# Patient Record
Sex: Male | Born: 1960 | Race: White | Hispanic: No | Marital: Married | Smoking: Current every day smoker
Health system: Southern US, Community
[De-identification: ages and names within clinical notes are randomized; demographics above are authoritative.]

## PROBLEM LIST (undated history)

## (undated) DIAGNOSIS — B1920 Unspecified viral hepatitis C without hepatic coma: Secondary | ICD-10-CM

## (undated) DIAGNOSIS — E162 Hypoglycemia, unspecified: Secondary | ICD-10-CM

## (undated) DIAGNOSIS — K439 Ventral hernia without obstruction or gangrene: Secondary | ICD-10-CM

## (undated) DIAGNOSIS — K859 Acute pancreatitis without necrosis or infection, unspecified: Secondary | ICD-10-CM

## (undated) DIAGNOSIS — G629 Polyneuropathy, unspecified: Secondary | ICD-10-CM

---

## 2011-06-07 ENCOUNTER — Ambulatory Visit: Payer: Self-pay | Admitting: Emergency Medicine

## 2012-12-13 ENCOUNTER — Emergency Department: Payer: Self-pay | Admitting: Emergency Medicine

## 2012-12-13 ENCOUNTER — Ambulatory Visit: Payer: Self-pay | Admitting: Family Medicine

## 2012-12-21 ENCOUNTER — Emergency Department: Payer: Self-pay | Admitting: Emergency Medicine

## 2013-11-12 ENCOUNTER — Emergency Department: Payer: Self-pay | Admitting: Internal Medicine

## 2013-11-12 LAB — COMPREHENSIVE METABOLIC PANEL
ALK PHOS: 72 U/L
ALT: 77 U/L — AB
AST: 138 U/L — AB (ref 15–37)
Albumin: 4.1 g/dL (ref 3.4–5.0)
Anion Gap: 11 (ref 7–16)
BILIRUBIN TOTAL: 1.4 mg/dL — AB (ref 0.2–1.0)
BUN: 10 mg/dL (ref 7–18)
CALCIUM: 8.9 mg/dL (ref 8.5–10.1)
CHLORIDE: 94 mmol/L — AB (ref 98–107)
Co2: 26 mmol/L (ref 21–32)
Creatinine: 0.83 mg/dL (ref 0.60–1.30)
Glucose: 138 mg/dL — ABNORMAL HIGH (ref 65–99)
OSMOLALITY: 264 (ref 275–301)
Potassium: 3 mmol/L — ABNORMAL LOW (ref 3.5–5.1)
Sodium: 131 mmol/L — ABNORMAL LOW (ref 136–145)
TOTAL PROTEIN: 6.9 g/dL (ref 6.4–8.2)

## 2013-11-12 LAB — URINALYSIS, COMPLETE
BILIRUBIN, UR: NEGATIVE
Bacteria: NONE SEEN
Blood: NEGATIVE
Glucose,UR: NEGATIVE mg/dL (ref 0–75)
Hyaline Cast: 2
LEUKOCYTE ESTERASE: NEGATIVE
Nitrite: NEGATIVE
PROTEIN: NEGATIVE
Ph: 6 (ref 4.5–8.0)
Specific Gravity: 1.014 (ref 1.003–1.030)
Squamous Epithelial: NONE SEEN
WBC UR: 2 /HPF (ref 0–5)

## 2013-11-12 LAB — DRUG SCREEN, URINE

## 2013-11-12 LAB — CBC
HCT: 46.1 % (ref 40.0–52.0)
HGB: 15.9 g/dL (ref 13.0–18.0)
MCH: 32.8 pg (ref 26.0–34.0)
MCHC: 34.5 g/dL (ref 32.0–36.0)
MCV: 95 fL (ref 80–100)
PLATELETS: 119 10*3/uL — AB (ref 150–440)
RBC: 4.85 10*6/uL (ref 4.40–5.90)
RDW: 16 % — AB (ref 11.5–14.5)
WBC: 10.6 10*3/uL (ref 3.8–10.6)

## 2013-11-12 LAB — PROTIME-INR
INR: 0.9
Prothrombin Time: 12.1 secs (ref 11.5–14.7)

## 2013-11-12 LAB — ETHANOL: Ethanol: <3 mg/dL

## 2013-11-12 LAB — CK TOTAL AND CKMB (NOT AT ARMC)
CK, Total: 313 U/L — ABNORMAL HIGH
CK-MB: 5.1 ng/mL — ABNORMAL HIGH (ref 0.5–3.6)

## 2013-11-17 LAB — CULTURE, BLOOD (SINGLE)

## 2014-09-11 ENCOUNTER — Encounter: Payer: Self-pay | Admitting: Emergency Medicine

## 2014-09-11 ENCOUNTER — Emergency Department: Payer: Medicaid Other

## 2014-09-11 ENCOUNTER — Emergency Department
Admission: EM | Admit: 2014-09-11 | Discharge: 2014-09-11 | Disposition: A | Discharge status: Home / Self Care | Payer: Medicaid Other | Attending: Emergency Medicine | Admitting: Emergency Medicine

## 2014-09-11 DIAGNOSIS — M545 Low back pain: Secondary | ICD-10-CM | POA: Diagnosis not present

## 2014-09-11 DIAGNOSIS — R6883 Chills (without fever): Secondary | ICD-10-CM | POA: Diagnosis not present

## 2014-09-11 DIAGNOSIS — E162 Hypoglycemia, unspecified: Secondary | ICD-10-CM | POA: Diagnosis not present

## 2014-09-11 DIAGNOSIS — R112 Nausea with vomiting, unspecified: Secondary | ICD-10-CM | POA: Diagnosis not present

## 2014-09-11 DIAGNOSIS — Z720 Tobacco use: Secondary | ICD-10-CM | POA: Diagnosis not present

## 2014-09-11 HISTORY — DX: Acute pancreatitis without necrosis or infection, unspecified: K85.90

## 2014-09-11 HISTORY — DX: Unspecified viral hepatitis C without hepatic coma: B19.20

## 2014-09-11 HISTORY — DX: Polyneuropathy, unspecified: G62.9

## 2014-09-11 LAB — URINALYSIS COMPLETE WITH MICROSCOPIC (ARMC ONLY)
BILIRUBIN URINE: NEGATIVE
Bacteria, UA: NONE SEEN
GLUCOSE, UA: 50 mg/dL — AB
Leukocytes, UA: NEGATIVE
NITRITE: NEGATIVE
PROTEIN: NEGATIVE mg/dL
SPECIFIC GRAVITY, URINE: 1.02 (ref 1.005–1.030)
pH: 5 (ref 5.0–8.0)

## 2014-09-11 LAB — TROPONIN I

## 2014-09-11 LAB — GLUCOSE, CAPILLARY: GLUCOSE-CAPILLARY: 229 mg/dL — AB (ref 65–99)

## 2014-09-11 LAB — BASIC METABOLIC PANEL
ANION GAP: 16 — AB (ref 5–15)
BUN: 12 mg/dL (ref 6–20)
CALCIUM: 9.2 mg/dL (ref 8.9–10.3)
CO2: 21 mmol/L — ABNORMAL LOW (ref 22–32)
Chloride: 93 mmol/L — ABNORMAL LOW (ref 101–111)
Creatinine, Ser: 0.79 mg/dL (ref 0.61–1.24)
Glucose, Bld: 235 mg/dL — ABNORMAL HIGH (ref 65–99)
Potassium: 4.7 mmol/L (ref 3.5–5.1)
Sodium: 130 mmol/L — ABNORMAL LOW (ref 135–145)

## 2014-09-11 LAB — CBC
HCT: 51 % (ref 40.0–52.0)
HEMOGLOBIN: 17.7 g/dL (ref 13.0–18.0)
MCH: 32.2 pg (ref 26.0–34.0)
MCHC: 34.8 g/dL (ref 32.0–36.0)
MCV: 92.7 fL (ref 80.0–100.0)
Platelets: 237 10*3/uL (ref 150–440)
RBC: 5.5 MIL/uL (ref 4.40–5.90)
RDW: 13.2 % (ref 11.5–14.5)
WBC: 17.1 10*3/uL — ABNORMAL HIGH (ref 3.8–10.6)

## 2014-09-11 LAB — HEPATIC FUNCTION PANEL
ALT: 87 U/L — AB (ref 17–63)
AST: 83 U/L — ABNORMAL HIGH (ref 15–41)
Albumin: 4.6 g/dL (ref 3.5–5.0)
Alkaline Phosphatase: 48 U/L (ref 38–126)
BILIRUBIN INDIRECT: 0.4 mg/dL (ref 0.3–0.9)
Bilirubin, Direct: 0.2 mg/dL (ref 0.1–0.5)
TOTAL PROTEIN: 7.3 g/dL (ref 6.5–8.1)
Total Bilirubin: 0.6 mg/dL (ref 0.3–1.2)

## 2014-09-11 LAB — LIPASE, BLOOD: Lipase: 13 U/L — ABNORMAL LOW (ref 22–51)

## 2014-09-11 MED ORDER — ONDANSETRON HCL 4 MG/2ML IJ SOLN
4.0000 mg | Freq: Once | INTRAMUSCULAR | Status: AC
  Administered 2014-09-11: 4 mg via INTRAVENOUS
  Filled 2014-09-11: qty 2

## 2014-09-11 MED ORDER — IOHEXOL 350 MG/ML SOLN
100.0000 mL | Freq: Once | INTRAVENOUS | Status: AC | PRN
  Administered 2014-09-11: 100 mL via INTRAVENOUS

## 2014-09-11 MED ORDER — MORPHINE SULFATE (PF) 4 MG/ML IV SOLN
4.0000 mg | Freq: Once | INTRAVENOUS | Status: AC
  Administered 2014-09-11: 4 mg via INTRAVENOUS
  Filled 2014-09-11: qty 1

## 2014-09-11 MED ORDER — SODIUM CHLORIDE 0.9 % IV BOLUS (SEPSIS)
1000.0000 mL | Freq: Once | INTRAVENOUS | Status: AC
  Administered 2014-09-11: 1000 mL via INTRAVENOUS

## 2014-09-11 MED ORDER — PROMETHAZINE HCL 25 MG PO TABS: 25.0000 mg | ORAL_TABLET | Freq: Four times a day (QID) | ORAL | Status: DC | PRN

## 2014-09-11 MED ORDER — IOHEXOL 240 MG/ML SOLN
25.0000 mL | Freq: Once | INTRAMUSCULAR | Status: AC | PRN
  Administered 2014-09-11: 25 mL via ORAL

## 2014-09-11 NOTE — ED Provider Notes (Signed)
[facility] Emergency Department Provider Note  Time seen: 6:28 PM  I have reviewed the triage vital signs and the nursing notes.   HISTORY  Chief Complaint Hypoglycemia    HPI Andres Pett. is a 54 y.o. male with a past medical history of pancreatitis, hepatitis C, presents the emergency department with nausea, vomiting, weakness and chills. According to the patient overnight he began feeling very nauseated, vomiting, feeling chills and generalized fatigue/weakness. He has been unable to keep down any liquids today due to the nausea and vomiting per patient. He finally called EMS at 1 PM and they were beginning to transport the patient but noted him to be hypoglycemic at 25, dosed him with dextrose, and his blood glucose improved. The patient thought this was probably the problem so he went back home. He states however for the last 4 or 5 hours since getting back home he has continued to feel very nauseated and weak and fatigued. Patient does not have a history of diabetes, nor does he take any diabetic medications. Patient has had pancreatitis in the past due to alcohol. Patient does drink alcohol he states most days of the week but not every day. He also states she was recently admitted to [facility] about 1.5 months ago for colitis. Denies any abdominal pain now or at any time. Does state mild nausea but much better after receiving Zofran by EMS.     Past Medical History  Diagnosis Date  . Hepatitis C   . Pancreatitis   . Neuropathy     There are no active problems to display for this patient.   No past surgical history on file.  No current outpatient prescriptions on file.  Allergies Review of patient's allergies indicates no known allergies.  No family history on file.  Social History Social History  Substance Use Topics  . Smoking status: Current Every Day Smoker  . Smokeless tobacco: None  . Alcohol Use: Yes    Review of  Systems Constitutional: Negative for fever. Cardiovascular: Negative for chest pain. Respiratory: Negative for shortness of breath. Gastrointestinal: Negative for abdominal pain. Positive for nausea and vomiting. Negative for diarrhea. Genitourinary: Negative for dysuria. Musculoskeletal: Mild lower back pain. 10-point ROS otherwise negative.  ____________________________________________   PHYSICAL EXAM:  VITAL SIGNS: ED Triage Vitals  Enc Vitals Group     BP 09/11/14 1729 135/77 mmHg     Pulse Rate 09/11/14 1729 122     Resp 09/11/14 1729 18     Temp 09/11/14 1729 97.1 F (36.2 C)     Temp Source 09/11/14 1729 Oral     SpO2 09/11/14 1729 97 %     Weight 09/11/14 1729 165 lb (74.844 kg)     Height 09/11/14 1729 5\' 11"  (1.803 m)     Head Cir --      Peak Flow --      Pain Score --      Pain Loc --      Pain Edu? --      Excl. in GC? --     Constitutional: Alert and oriented, no distress, but does appear fatigued. Eyes: Normal exam ENT   Mouth/Throat: Dry mucous membranes. Cardiovascular: Normal rate, regular rhythm. No murmurs, rubs, or gallops. Respiratory: Normal respiratory effort without tachypnea nor retractions. Breath sounds are clear Gastrointestinal: Soft and nontender. No distention.   Musculoskeletal: Nontender with normal range of motion in all extremities.  Neurologic:  Normal speech and language. No gross  focal neurologic deficits Skin:  Skin is warm, dry and intact.  Psychiatric: Mood and affect are normal. Speech and behavior are normal.  ____________________________________________     RADIOLOGY  CT within normal limits  ____________________________________________   INITIAL IMPRESSION / ASSESSMENT AND PLAN / ED COURSE  Pertinent labs & imaging results that were available during my care of the patient were reviewed by me and considered in my medical decision making (see chart for details).  Patient presents for nausea, vomiting, unable  to tolerate fluids today. Had a hypoglycemic episode with a glucose of 25, and the patient is not diabetic. We will check labs including hepatic function panel and lipase. We'll IV hydrate. The patient denies any current nausea, denies any abdominal pain now or at any time.  Labs largely within normal limits/baseline. Discussed with the patient we'll proceed with a CT abdomen and pelvis to help further evaluate.  Labs are largely within normal limits, CT does not show any acute abnormality. Patient states he is feeling much better, no longer feels nauseated, has no abdominal pain, he is asking to be discharged home. He states he has a follow-up appointment tomorrow with his primary care physician. I discussed strict return precautions for any further nausea, vomiting unable to keep down fluids, abdominal pain or fever. Patient is agreeable to plan. We will discharge home with Phenergan as needed, I told the patient he needs to drink plenty of fluids and glucose-containing fluids of the next several days. Patient is agreeable to plan. ____________________________________________   FINAL CLINICAL IMPRESSION(S) / ED DIAGNOSES  Nausea and vomiting Hypoglycemia   Minna Antis, MD 09/11/14 2149

## 2014-09-11 NOTE — ED Notes (Signed)
Pt with low blood sugar today. Ems came out to his house and blood sugar was 25, ems gave D50 and brought it up to 253, this was at 1300 today.

## 2014-09-11 NOTE — Discharge Instructions (Signed)
Nausea and Vomiting Nausea means you feel sick to your stomach. Throwing up (vomiting) is a reflex where stomach contents come out of your mouth. HOME CARE   Take medicine as told by your doctor.  Do not force yourself to eat. However, you do need to drink fluids.  If you feel like eating, eat a normal diet as told by your doctor.  Eat rice, wheat, potatoes, bread, lean meats, yogurt, fruits, and vegetables.  Avoid high-fat foods.  Drink enough fluids to keep your pee (urine) clear or pale yellow.  Ask your doctor how to replace body fluid losses (rehydrate). Signs of body fluid loss (dehydration) include:  Feeling very thirsty.  Dry lips and mouth.  Feeling dizzy.  Dark pee.  Peeing less than normal.  Feeling confused.  Fast breathing or heart rate. GET HELP RIGHT AWAY IF:   You have blood in your throw up.  You have black or bloody poop (stool).  You have a bad headache or stiff neck.  You feel confused.  You have bad belly (abdominal) pain.  You have chest pain or trouble breathing.  You do not pee at least once every 8 hours.  You have cold, clammy skin.  You keep throwing up after 24 to 48 hours.  You have a fever. MAKE SURE YOU:   Understand these instructions.  Will watch your condition.  Will get help right away if you are not doing well or get worse. Document Released: 06/09/2007 Document Revised: 03/15/2011 Document Reviewed: 05/22/2010 [facility] Patient Information 2015 [place], [place]. This information is not intended to replace advice given to you by your health care provider. Make sure you discuss any questions you have with your health care provider.  Low Blood Sugar Low blood sugar (hypoglycemia) means that the level of sugar in your blood is lower than it should be. Signs of low blood sugar include:  Getting sweaty.  Feeling hungry.  Feeling dizzy or weak.  Feeling sleepier than normal.  Feeling nervous.  Headaches.  Having  a fast heartbeat. Low blood sugar can happen fast and can be an emergency. Your doctor can do tests to check your blood sugar level. You can have low blood sugar and not have diabetes. HOME CARE  Check your blood sugar as told by your doctor. If it is less than 70 mg/dl or as told by your doctor, take 1 of the following:  3 to 4 glucose tablets.   cup clear juice.   cup soda pop, not diet.  1 cup milk.  5 to 6 hard candies.  Recheck blood sugar after 15 minutes. Repeat until it is at the right level.  Eat a snack if it is more than 1 hour until the next meal.  Only take medicine as told by your doctor.  Do not skip meals. Eat on time.  Do not drink alcohol except with meals.  Check your blood glucose before driving.  Check your blood glucose before and after exercise.  Always carry treatment with you, such as glucose pills.  Always wear a medical alert bracelet if you have diabetes. GET HELP RIGHT AWAY IF:   Your blood glucose goes below 70 mg/dl or as told by your doctor, and you:  Are confused.  Are not able to swallow.  Pass out (faint).  You cannot treat yourself. You may need someone to help you.  You have low blood sugar problems often.  You have problems from your medicines.  You are not feeling  better after 3 to 4 days.  You have vision changes. MAKE SURE YOU:   Understand these instructions.  Will watch this condition.  Will get help right away if you are not doing well or get worse. Document Released: 03/17/2009 Document Revised: 03/15/2011 Document Reviewed: 03/17/2009 [facility] Patient Information 2015 [place], [place]. This information is not intended to replace advice given to you by your health care provider. Make sure you discuss any questions you have with your health care provider.

## 2015-01-27 ENCOUNTER — Other Ambulatory Visit: Payer: Self-pay | Admitting: Gastroenterology

## 2015-01-27 DIAGNOSIS — B182 Chronic viral hepatitis C: Secondary | ICD-10-CM

## 2015-01-29 ENCOUNTER — Ambulatory Visit: Admission: RE | Admit: 2015-01-29 | Payer: Medicaid Other | Source: Ambulatory Visit

## 2015-02-03 ENCOUNTER — Ambulatory Visit: Payer: Medicaid Other

## 2015-03-24 ENCOUNTER — Ambulatory Visit: Admission: RE | Admit: 2015-03-24 | Payer: Medicaid Other | Source: Ambulatory Visit

## 2015-04-15 ENCOUNTER — Observation Stay
Admission: EM | Admit: 2015-04-15 | Discharge: 2015-04-16 | Disposition: A | Discharge status: Home / Self Care | Payer: Medicaid Other | Attending: Internal Medicine | Admitting: Internal Medicine

## 2015-04-15 ENCOUNTER — Emergency Department: Payer: Medicaid Other

## 2015-04-15 ENCOUNTER — Encounter: Payer: Self-pay | Admitting: *Deleted

## 2015-04-15 DIAGNOSIS — K219 Gastro-esophageal reflux disease without esophagitis: Secondary | ICD-10-CM | POA: Diagnosis not present

## 2015-04-15 DIAGNOSIS — E870 Hyperosmolality and hypernatremia: Secondary | ICD-10-CM | POA: Diagnosis not present

## 2015-04-15 DIAGNOSIS — G629 Polyneuropathy, unspecified: Secondary | ICD-10-CM | POA: Insufficient documentation

## 2015-04-15 DIAGNOSIS — B1920 Unspecified viral hepatitis C without hepatic coma: Secondary | ICD-10-CM | POA: Insufficient documentation

## 2015-04-15 DIAGNOSIS — Z8719 Personal history of other diseases of the digestive system: Secondary | ICD-10-CM | POA: Insufficient documentation

## 2015-04-15 DIAGNOSIS — F17210 Nicotine dependence, cigarettes, uncomplicated: Secondary | ICD-10-CM | POA: Insufficient documentation

## 2015-04-15 DIAGNOSIS — F05 Delirium due to known physiological condition: Secondary | ICD-10-CM | POA: Insufficient documentation

## 2015-04-15 DIAGNOSIS — R569 Unspecified convulsions: Secondary | ICD-10-CM | POA: Diagnosis present

## 2015-04-15 DIAGNOSIS — G40309 Generalized idiopathic epilepsy and epileptic syndromes, not intractable, without status epilepticus: Principal | ICD-10-CM | POA: Insufficient documentation

## 2015-04-15 DIAGNOSIS — E871 Hypo-osmolality and hyponatremia: Secondary | ICD-10-CM | POA: Diagnosis not present

## 2015-04-15 DIAGNOSIS — G319 Degenerative disease of nervous system, unspecified: Secondary | ICD-10-CM | POA: Insufficient documentation

## 2015-04-15 HISTORY — DX: Hypoglycemia, unspecified: E16.2

## 2015-04-15 HISTORY — DX: Ventral hernia without obstruction or gangrene: K43.9

## 2015-04-15 LAB — BASIC METABOLIC PANEL
Anion gap: 10 (ref 5–15)
BUN: 9 mg/dL (ref 6–20)
CHLORIDE: 99 mmol/L — AB (ref 101–111)
CO2: 24 mmol/L (ref 22–32)
CREATININE: 0.83 mg/dL (ref 0.61–1.24)
Calcium: 9.2 mg/dL (ref 8.9–10.3)
GFR calc Af Amer: >60 mL/min (ref >60)
GFR calc non Af Amer: >60 mL/min (ref >60)
Glucose, Bld: 152 mg/dL — ABNORMAL HIGH (ref 65–99)
Potassium: 3.6 mmol/L (ref 3.5–5.1)
SODIUM: 133 mmol/L — AB (ref 135–145)

## 2015-04-15 LAB — CBC WITH DIFFERENTIAL/PLATELET
Basophils Absolute: 0.1 10*3/uL (ref 0–0.1)
Basophils Relative: 1 %
EOS ABS: 0.2 10*3/uL (ref 0–0.7)
EOS PCT: 2 %
HCT: 41.5 % (ref 40.0–52.0)
HEMOGLOBIN: 14.5 g/dL (ref 13.0–18.0)
LYMPHS ABS: 1.9 10*3/uL (ref 1.0–3.6)
Lymphocytes Relative: 25 %
MCH: 33.7 pg (ref 26.0–34.0)
MCHC: 34.8 g/dL (ref 32.0–36.0)
MCV: 96.8 fL (ref 80.0–100.0)
MONO ABS: 0.9 10*3/uL (ref 0.2–1.0)
MONOS PCT: 12 %
Neutro Abs: 4.7 10*3/uL (ref 1.4–6.5)
Neutrophils Relative %: 60 %
PLATELETS: 198 10*3/uL (ref 150–440)
RBC: 4.29 MIL/uL — ABNORMAL LOW (ref 4.40–5.90)
RDW: 14.3 % (ref 11.5–14.5)
WBC: 7.7 10*3/uL (ref 3.8–10.6)

## 2015-04-15 LAB — URINE DRUG SCREEN, QUALITATIVE (ARMC ONLY)
Amphetamines, Ur Screen: NOT DETECTED
Barbiturates, Ur Screen: NOT DETECTED
Benzodiazepine, Ur Scrn: NOT DETECTED
CANNABINOID 50 NG, UR ~~LOC~~: POSITIVE — AB
COCAINE METABOLITE, UR ~~LOC~~: NOT DETECTED
MDMA (ECSTASY) UR SCREEN: NOT DETECTED
Methadone Scn, Ur: NOT DETECTED
Opiate, Ur Screen: NOT DETECTED
PHENCYCLIDINE (PCP) UR S: NOT DETECTED
Tricyclic, Ur Screen: NOT DETECTED

## 2015-04-15 LAB — ETHANOL

## 2015-04-15 MED ORDER — OXYCODONE HCL 5 MG PO TABS
5.0000 mg | ORAL_TABLET | ORAL | Status: DC | PRN
  Administered 2015-04-15 – 2015-04-16 (×2): 5 mg via ORAL
  Filled 2015-04-15 (×2): qty 1

## 2015-04-15 MED ORDER — ONDANSETRON HCL 4 MG/2ML IJ SOLN: 4.0000 mg | Freq: Four times a day (QID) | INTRAMUSCULAR | Status: DC | PRN

## 2015-04-15 MED ORDER — SODIUM CHLORIDE 0.9 % IV SOLN
1000.0000 mg | Freq: Two times a day (BID) | INTRAVENOUS | Status: DC
  Administered 2015-04-15 – 2015-04-16 (×2): 1000 mg via INTRAVENOUS
  Filled 2015-04-15 (×3): qty 10

## 2015-04-15 MED ORDER — ACETAMINOPHEN 325 MG PO TABS: 650.0000 mg | ORAL_TABLET | Freq: Four times a day (QID) | ORAL | Status: DC | PRN

## 2015-04-15 MED ORDER — ACETAMINOPHEN 650 MG RE SUPP: 650.0000 mg | Freq: Four times a day (QID) | RECTAL | Status: DC | PRN

## 2015-04-15 MED ORDER — MORPHINE SULFATE (PF) 2 MG/ML IV SOLN
2.0000 mg | INTRAVENOUS | Status: DC | PRN
  Administered 2015-04-15 – 2015-04-16 (×2): 2 mg via INTRAVENOUS
  Filled 2015-04-15 (×2): qty 1

## 2015-04-15 MED ORDER — ONDANSETRON HCL 4 MG PO TABS: 4.0000 mg | ORAL_TABLET | Freq: Four times a day (QID) | ORAL | Status: DC | PRN

## 2015-04-15 MED ORDER — SODIUM CHLORIDE 0.9 % IV SOLN
INTRAVENOUS | Status: DC
  Administered 2015-04-15 – 2015-04-16 (×2): via INTRAVENOUS

## 2015-04-15 MED ORDER — ENOXAPARIN SODIUM 40 MG/0.4ML ~~LOC~~ SOLN
40.0000 mg | Freq: Every day | SUBCUTANEOUS | Status: DC
  Administered 2015-04-15: 23:00:00 40 mg via SUBCUTANEOUS
  Filled 2015-04-15: qty 0.4

## 2015-04-15 MED ORDER — ENOXAPARIN SODIUM 40 MG/0.4ML ~~LOC~~ SOLN: 30.0000 mg | SUBCUTANEOUS | Status: DC

## 2015-04-15 NOTE — ED Notes (Signed)
Pt arrived to ED from home after wife reports pt had a 2 minute long seizure. Pt fell from a standing position and hit head on a wooden table. EMS reports pt broke the table with his head. Pt reports he has left sided head pain at this time. EMS reports pt had a second seizure prior to their arrival and was posticle. EMS reports pt was confused throughout transport however pt is A&O x 4 at this time. Pt presents tired but is able to talk in complete sentence and answer questions. Pt has hx of of seizures but has not seen a neurologist and has never taken medications. Pts wife reports last seizure occurred 1 year ago.

## 2015-04-15 NOTE — ED Notes (Signed)
Adam, RN attempted to call report to 1C.  Receiving nurse in pt's room and unavailable to take report at this time.

## 2015-04-15 NOTE — H&P (Signed)
[facility] [place] [facility]   PATIENT NAME: Andres ShepherdRobert Brown    MR#:  [id number]  DATE OF BIRTH:  1960/03/23   DATE OF ADMISSION:  04/15/2015  PRIMARY CARE PHYSICIAN: No primary care provider on file.   REQUESTING/REFERRING PHYSICIAN: Derrill KayGoodman  CHIEF COMPLAINT:   Chief Complaint  Patient presents with  . Seizures    HISTORY OF PRESENT ILLNESS:  Andres ShepherdRobert Brown  is a 55 y.o. male with a known history of hepatitis C who is presenting with seizure. Witnessed seizure by wife who is present at bedside states that full tonic-clonic seizure followed by postictal state in total entire episode lasted 15 minutes (including postictal). Patient states in usual state of health no illnesses or further issues prior to seizure. He has had 2 prior seizures which occurred the same day tonic-clonic approximate 1-1.5 years ago.  PAST MEDICAL HISTORY:   Past Medical History  Diagnosis Date  . Hepatitis C   . Pancreatitis   . Neuropathy (HCC)   . Hypoglycemia   . Hernia of abdominal wall     left sided. PCP aware.     PAST SURGICAL HISTORY:  History reviewed. No pertinent past surgical history.  SOCIAL HISTORY:   Social History  Substance Use Topics  . Smoking status: Current Every Day Smoker -- 1.00 packs/day    Types: Cigarettes  . Smokeless tobacco: Not on file  . Alcohol Use: Yes     Comment: occasionally    FAMILY HISTORY:   Family History  Problem Relation Age of Onset  . Seizures Neg Hx     DRUG ALLERGIES:  No Known Allergies  REVIEW OF SYSTEMS:  REVIEW OF SYSTEMS:  CONSTITUTIONAL: Denies fevers, chills, fatigue, weakness.  EYES: Denies blurred vision, double vision, or eye pain.  EARS, NOSE, THROAT: Denies tinnitus, ear pain, hearing loss.  RESPIRATORY: denies cough, shortness of breath, wheezing  CARDIOVASCULAR: Denies chest pain, palpitations, edema.  GASTROINTESTINAL: Denies nausea, vomiting, diarrhea, abdominal pain.  GENITOURINARY: Denies  dysuria, hematuria.  ENDOCRINE: Denies nocturia or thyroid problems. HEMATOLOGIC AND LYMPHATIC: Denies easy bruising or bleeding.  SKIN: Denies rash or lesions.  MUSCULOSKELETAL: Denies pain in neck, back, shoulder, knees, hips, or further arthritic symptoms.  NEUROLOGIC: Denies paralysis, paresthesias.  PSYCHIATRIC: Denies anxiety or depressive symptoms. Otherwise full review of systems performed by me is negative.   MEDICATIONS AT HOME:   Prior to Admission medications   Not on File      VITAL SIGNS:  Blood pressure 131/86, pulse 79, temperature 98.3 F (36.8 C), temperature source Oral, resp. rate 22, height 6' (1.829 m), weight 74.844 kg (165 lb), SpO2 96 %.  PHYSICAL EXAMINATION:  VITAL SIGNS: Filed Vitals:   04/15/15 1930 04/15/15 2000  BP: 118/80 131/86  Pulse: 81 79  Temp:    Resp: 17 22   GENERAL:54 y.o.male currently in no acute distress.  HEAD: Normocephalic, atraumatic.  EYES: Pupils equal, round, reactive to light. Extraocular muscles intact. No scleral icterus.  MOUTH: Moist mucosal membrane. Dentition intact. No abscess noted.  EAR, NOSE, THROAT: Clear without exudates. No external lesions.  NECK: Supple. No thyromegaly. No nodules. No JVD.  PULMONARY: Clear to ascultation, without wheeze rails or rhonci. No use of accessory muscles, Good respiratory effort. good air entry bilaterally CHEST: Nontender to palpation.  CARDIOVASCULAR: S1 and S2. Regular rate and rhythm. No murmurs, rubs, or gallops. No edema. Pedal pulses 2+ bilaterally.  GASTROINTESTINAL: Soft, nontender, nondistended. No masses. Positive bowel sounds. No hepatosplenomegaly.  MUSCULOSKELETAL: No swelling, clubbing, or edema. Range of motion full in all extremities.  NEUROLOGIC: Cranial nerves II through XII are intact. No gross focal neurological deficits. Sensation intact. Reflexes intact.  SKIN: No ulceration, lesions, rashes, or cyanosis. Skin warm and dry. Turgor intact.  PSYCHIATRIC: Mood,  affect within normal limits. The patient is awake, alert and oriented x 3. Insight, judgment intact.    LABORATORY PANEL:   CBC  Recent Labs Lab 04/15/15 1853  WBC 7.7  HGB 14.5  HCT 41.5  PLT 198   ------------------------------------------------------------------------------------------------------------------  Chemistries   Recent Labs Lab 04/15/15 1853  NA 133*  K 3.6  CL 99*  CO2 24  GLUCOSE 152*  BUN 9  CREATININE 0.83  CALCIUM 9.2   ------------------------------------------------------------------------------------------------------------------  Cardiac Enzymes No results for input(s): TROPONINI in the last 168 hours. ------------------------------------------------------------------------------------------------------------------  RADIOLOGY:  Ct Head Wo Contrast  04/15/2015  CLINICAL DATA:  Status post fall, seizure EXAM: CT HEAD WITHOUT CONTRAST TECHNIQUE: Contiguous axial images were obtained from the base of the skull through the vertex without intravenous contrast. COMPARISON:  None. FINDINGS: There is no evidence of mass effect, midline shift, or extra-axial fluid collections. There is no evidence of a space-occupying lesion or intracranial hemorrhage. There is no evidence of a cortical-based area of acute infarction. There is generalized cerebral atrophy. The ventricles and sulci are appropriate for the patient's age. The basal cisterns are patent. Visualized portions of the orbits are unremarkable. The visualized portions of the paranasal sinuses and mastoid air cells are unremarkable. The osseous structures are unremarkable. IMPRESSION: No acute intracranial pathology. Electronically Signed   By: Elige Ko   On: 04/15/2015 19:26    EKG:   Orders placed or performed during the hospital encounter of 04/15/15  . EKG 12-Lead  . EKG 12-Lead    IMPRESSION AND PLAN:   55 year old Caucasian gentleman history of hepatitis C presenting after seizures  1.  Seizure: Neurology consult Keppra load seizure precautions with neuro checks 2. Hyponatremia: IV fluid hydration follow sodium 3. Venous thrombi embolism prophylactic: Lovenox    All the records are reviewed and case discussed with ED provider. Management plans discussed with the patient, family and they are in agreement.  CODE STATUS: Full  TOTAL TIME TAKING CARE OF THIS PATIENT: 33 minutes.    Hower,  Mardi Mainland.D on 04/15/2015 at 9:02 PM  Between 7am to 6pm - Pager - [phone]  After 6pm: House Pager: - [phone]  [facility]  Office  [phone]  CC: Primary care physician; No primary care provider on file.

## 2015-04-15 NOTE — ED Provider Notes (Signed)
[facility] Emergency Department Provider Note    ____________________________________________  Time seen: ~2025  I have reviewed the triage vital signs and the nursing notes.   HISTORY  Chief Complaint Seizures   History limited by: Not Limited   HPI Andres LowRobert E Macmurray Jr. is a 55 y.o. male who presents to the emergency department today after seizure. The patient states he had had a normal day. Had a drink as normal. He then went up and left the dining room table when he started having a seizure. Wife describes it as the full body shaking. She states that last maybe 2 minutes. Patient was quite confused after this. EMS was called. When EMS Their sugar was 150. During transport patient had a another seizure. Patient does not recall either event. Wife states that the patient had a seizure roughly 1 year ago however at that time it was attributed to hypoglycemia. The patient denies any recent fevers, nausea or vomiting.    Past Medical History  Diagnosis Date  . Hepatitis C   . Pancreatitis   . Neuropathy (HCC)   . Hypoglycemia   . Hernia of abdominal wall     left sided. PCP aware.     There are no active problems to display for this patient.   History reviewed. No pertinent past surgical history.  Current Outpatient Rx  Name  Route  Sig  Dispense  Refill  . promethazine (PHENERGAN) 25 MG tablet   Oral   Take 1 tablet (25 mg total) by mouth every 6 (six) hours as needed for nausea or vomiting.   30 tablet   0     Allergies Review of patient's allergies indicates no known allergies.  History reviewed. No pertinent family history.  Social History Social History  Substance Use Topics  . Smoking status: Current Every Day Smoker -- 1.00 packs/day    Types: Cigarettes  . Smokeless tobacco: None  . Alcohol Use: Yes     Comment: occasionally    Review of Systems  Constitutional: Negative for fever. Cardiovascular: Negative for chest  pain. Respiratory: Negative for shortness of breath. Gastrointestinal: Negative for abdominal pain, vomiting and diarrhea. Neurological: Positive for seizure   10-point ROS otherwise negative.  ____________________________________________   PHYSICAL EXAM:  VITAL SIGNS: ED Triage Vitals  Enc Vitals Group     BP 04/15/15 1848 131/74 mmHg     Pulse Rate 04/15/15 1848 85     Resp 04/15/15 1848 21     Temp 04/15/15 1848 98.3 F (36.8 C)     Temp Source 04/15/15 1848 Oral     SpO2 04/15/15 1841 96 %     Weight 04/15/15 1852 165 lb (74.844 kg)     Height 04/15/15 1852 6' (1.829 m)     Head Cir --      Peak Flow --      Pain Score 04/15/15 1850 8   Constitutional: Alert and oriented. Well appearing and in no distress. Eyes: Conjunctivae are normal. PERRL. Normal extraocular movements. ENT   Head: Normocephalic, Small hematoma to the left frontal temporal region.   Nose: No congestion/rhinnorhea.   Mouth/Throat: Mucous membranes are moist.   Neck: No stridor. No midline tenderness. Hematological/Lymphatic/Immunilogical: No cervical lymphadenopathy. Cardiovascular: Normal rate, regular rhythm.  No murmurs, rubs, or gallops. Respiratory: Normal respiratory effort without tachypnea nor retractions. Breath sounds are clear and equal bilaterally. No wheezes/rales/rhonchi. Gastrointestinal: Soft and nontender. No distention.  Genitourinary: Deferred Musculoskeletal: Normal range of motion in  all extremities. No joint effusions.  Some bruising and abrasion over the left arm however no deformity and normal range of motion. Neurologic:  Normal speech and language. No gross focal neurologic deficits are appreciated.  Skin:  Skin is warm, dry and intact. No rash noted. Psychiatric: Mood and affect are normal. Speech and behavior are normal. Patient exhibits appropriate insight and judgment.  ____________________________________________    LABS (pertinent  positives/negatives)  Labs Reviewed  CBC WITH DIFFERENTIAL/PLATELET - Abnormal; Notable for the following:    RBC 4.29 (*)    All other components within normal limits  BASIC METABOLIC PANEL - Abnormal; Notable for the following:    Sodium 133 (*)    Chloride 99 (*)    Glucose, Bld 152 (*)    All other components within normal limits  ETHANOL  URINE DRUG SCREEN, QUALITATIVE ([facility] ONLY)  BASIC METABOLIC PANEL  CBC     ____________________________________________   EKG  I, Phineas Semen, attending physician, personally viewed and interpreted this EKG  EKG Time: 1848 Rate: 84 Rhythm: normal sinus rhythm Axis: normal Intervals: qtc 450 QRS: narrow ST changes: no st elevation Impression: normal ekg   ____________________________________________    RADIOLOGY  CT head IMPRESSION: No acute intracranial pathology.   ____________________________________________   PROCEDURES  Procedure(s) performed: None  Critical Care performed: No  ____________________________________________   INITIAL IMPRESSION / ASSESSMENT AND PLAN / ED COURSE  Pertinent labs & imaging results that were available during my care of the patient were reviewed by me and considered in my medical decision making (see chart for details).  Patient presented to the emergency department today because of concern for seizure. The patient had 2 seizures this evening. It has not had a seizure workup in the past. No obvious etiology of the seizure at this time. Will admit to the hospital service.  ____________________________________________   FINAL CLINICAL IMPRESSION(S) / ED DIAGNOSES  Final diagnoses:  Seizure (HCC)     Phineas Semen, MD 04/15/15 2152

## 2015-04-16 ENCOUNTER — Observation Stay: Payer: Medicaid Other

## 2015-04-16 LAB — CBC
HCT: 38.6 % — ABNORMAL LOW (ref 40.0–52.0)
Hemoglobin: 13.4 g/dL (ref 13.0–18.0)
MCH: 33.3 pg (ref 26.0–34.0)
MCHC: 34.7 g/dL (ref 32.0–36.0)
MCV: 95.9 fL (ref 80.0–100.0)
PLATELETS: 180 10*3/uL (ref 150–440)
RBC: 4.03 MIL/uL — AB (ref 4.40–5.90)
RDW: 14.5 % (ref 11.5–14.5)
WBC: 8.3 10*3/uL (ref 3.8–10.6)

## 2015-04-16 LAB — HEPATIC FUNCTION PANEL
ALT: 71 U/L — AB (ref 17–63)
AST: 85 U/L — ABNORMAL HIGH (ref 15–41)
Albumin: 3.7 g/dL (ref 3.5–5.0)
Alkaline Phosphatase: 43 U/L (ref 38–126)
BILIRUBIN INDIRECT: 0.3 mg/dL (ref 0.3–0.9)
Bilirubin, Direct: 0.2 mg/dL (ref 0.1–0.5)
TOTAL PROTEIN: 5.9 g/dL — AB (ref 6.5–8.1)
Total Bilirubin: 0.5 mg/dL (ref 0.3–1.2)

## 2015-04-16 LAB — BASIC METABOLIC PANEL
ANION GAP: 6 (ref 5–15)
BUN: 9 mg/dL (ref 6–20)
CHLORIDE: 105 mmol/L (ref 101–111)
CO2: 25 mmol/L (ref 22–32)
Calcium: 8.6 mg/dL — ABNORMAL LOW (ref 8.9–10.3)
Creatinine, Ser: 0.68 mg/dL (ref 0.61–1.24)
GFR calc non Af Amer: >60 mL/min (ref >60)
GLUCOSE: 106 mg/dL — AB (ref 65–99)
POTASSIUM: 3.5 mmol/L (ref 3.5–5.1)
Sodium: 136 mmol/L (ref 135–145)

## 2015-04-16 LAB — LIPASE, BLOOD: Lipase: 20 U/L (ref 11–51)

## 2015-04-16 MED ORDER — PANTOPRAZOLE SODIUM 40 MG PO TBEC: 40.0000 mg | DELAYED_RELEASE_TABLET | Freq: Two times a day (BID) | ORAL | Status: DC

## 2015-04-16 MED ORDER — BENZONATATE 100 MG PO CAPS: 100.0000 mg | ORAL_CAPSULE | Freq: Three times a day (TID) | ORAL | Status: DC | PRN

## 2015-04-16 MED ORDER — LEVETIRACETAM 1000 MG PO TABS: 1000.0000 mg | ORAL_TABLET | Freq: Two times a day (BID) | ORAL | Status: AC

## 2015-04-16 MED ORDER — BENZONATATE 100 MG PO CAPS: 100.0000 mg | ORAL_CAPSULE | Freq: Three times a day (TID) | ORAL | Status: AC | PRN

## 2015-04-16 MED ORDER — LEVETIRACETAM 500 MG PO TABS: 1000.0000 mg | ORAL_TABLET | Freq: Two times a day (BID) | ORAL | Status: DC

## 2015-04-16 MED ORDER — OMEPRAZOLE 20 MG PO CPDR: 20.0000 mg | DELAYED_RELEASE_CAPSULE | Freq: Every day | ORAL | Status: AC

## 2015-04-16 MED ORDER — GADOBENATE DIMEGLUMINE 529 MG/ML IV SOLN
15.0000 mL | Freq: Once | INTRAVENOUS | Status: AC | PRN
  Administered 2015-04-16: 13:00:00 15 mL via INTRAVENOUS

## 2015-04-16 MED ORDER — PANTOPRAZOLE SODIUM 40 MG PO TBEC: 40.0000 mg | DELAYED_RELEASE_TABLET | Freq: Every day | ORAL | Status: DC

## 2015-04-16 MED ORDER — LORAZEPAM 2 MG/ML IJ SOLN
2.0000 mg | Freq: Once | INTRAMUSCULAR | Status: AC
  Administered 2015-04-16: 2 mg via INTRAVENOUS
  Filled 2015-04-16: qty 1

## 2015-04-16 NOTE — Discharge Instructions (Signed)
1. No driving for 6 months after a seizure 2. No marijuana

## 2015-04-16 NOTE — Consult Note (Signed)
CC: seizurs  HPI: Andres LowRobert E Likes Jr. is an 55 y.o. male  male with a known history of hepatitis C who is presenting with seizure. Witnessed seizure by wife who is present at bedside states that full tonic-clonic seizure followed by postictal state in total entire episode lasted 15 minutes (including postictal). Pt had an episode of seizure x 2 about 1-2 yrs ago Currently back to baseline.    Past Medical History  Diagnosis Date  . Hepatitis C   . Pancreatitis   . Neuropathy (HCC)   . Hypoglycemia   . Hernia of abdominal wall     left sided. PCP aware.     History reviewed. No pertinent past surgical history.  Family History  Problem Relation Age of Onset  . Seizures Neg Hx     Social History:  reports that he has been smoking Cigarettes.  He has been smoking about 1.00 pack per day. He does not have any smokeless tobacco history on file. He reports that he drinks alcohol. He reports that he does not use illicit drugs.  No Known Allergies  Medications: I have reviewed the patient's current medications.  ROS: History obtained from the patient  General ROS: negative for - chills, fatigue, fever, night sweats, weight gain or weight loss Psychological ROS: negative for - behavioral disorder, hallucinations, memory difficulties, mood swings or suicidal ideation Ophthalmic ROS: negative for - blurry vision, double vision, eye pain or loss of vision ENT ROS: negative for - epistaxis, nasal discharge, oral lesions, sore throat, tinnitus or vertigo Allergy and Immunology ROS: negative for - hives or itchy/watery eyes Hematological and Lymphatic ROS: negative for - bleeding problems, bruising or swollen lymph nodes Endocrine ROS: negative for - galactorrhea, hair pattern changes, polydipsia/polyuria or temperature intolerance Respiratory ROS: negative for - cough, hemoptysis, shortness of breath or wheezing Cardiovascular ROS: negative for - chest pain, dyspnea on exertion, edema or  irregular heartbeat Gastrointestinal ROS: negative for - abdominal pain, diarrhea, hematemesis, nausea/vomiting or stool incontinence Genito-Urinary ROS: negative for - dysuria, hematuria, incontinence or urinary frequency/urgency Musculoskeletal ROS: negative for - joint swelling or muscular weakness Neurological ROS: as noted in HPI Dermatological ROS: negative for rash and skin lesion changes  Physical Examination: Blood pressure 101/55, pulse 75, temperature 98 F (36.7 C), temperature source Oral, resp. rate 18, height 6' (1.829 m), weight 165 lb (74.844 kg), SpO2 96 %.   Neurological Examination Mental Status: Alert, oriented, thought content appropriate.  Speech fluent without evidence of aphasia.  Able to follow 3 step commands without difficulty. Cranial Nerves: II: Discs flat bilaterally; Visual fields grossly normal, pupils equal, round, reactive to light and accommodation III,IV, VI: ptosis not present, extra-ocular motions intact bilaterally V,VII: smile symmetric, facial light touch sensation normal bilaterally VIII: hearing normal bilaterally IX,X: gag reflex present XI: bilateral shoulder shrug XII: midline tongue extension Motor: Right : Upper extremity   5/5    Left:     Upper extremity   5/5  Lower extremity   5/5     Lower extremity   5/5 Tone and bulk:normal tone throughout; no atrophy noted Sensory: Pinprick and light touch intact throughout, bilaterally Deep Tendon Reflexes: 1+ and symmetric throughout Plantars: Right: downgoing   Left: downgoing Cerebellar: normal finger-to-nose, normal rapid alternating movements and normal heel-to-shin test Gait: not tested      Laboratory Studies:   Basic Metabolic Panel:  Recent Labs Lab 04/15/15 1853 04/16/15 0500  NA 133* 136  K 3.6 3.5  CL 99* 105  CO2 24 25  GLUCOSE 152* 106*  BUN 9 9  CREATININE 0.83 0.68  CALCIUM 9.2 8.6*    Liver Function Tests:  Recent Labs Lab 04/16/15 0500  AST 85*   ALT 71*  ALKPHOS 43  BILITOT 0.5  PROT 5.9*  ALBUMIN 3.7    Recent Labs Lab 04/16/15 0500  LIPASE 20   No results for input(s): AMMONIA in the last 168 hours.  CBC:  Recent Labs Lab 04/15/15 1853 04/16/15 0500  WBC 7.7 8.3  NEUTROABS 4.7  --   HGB 14.5 13.4  HCT 41.5 38.6*  MCV 96.8 95.9  PLT 198 180    Cardiac Enzymes: No results for input(s): CKTOTAL, CKMB, CKMBINDEX, TROPONINI in the last 168 hours.  BNP: Invalid input(s): POCBNP  CBG: No results for input(s): GLUCAP in the last 168 hours.  Microbiology: Results for orders placed or performed in visit on 11/12/13  Culture, blood (single)     Status: None   Collection Time: 11/12/13  4:41 PM  Result Value Ref Range Status   Micro Text Report   Final       COMMENT                   NO GROWTH AEROBICALLY/ANAEROBICALLY IN 5 DAYS   ANTIBIOTIC                                                      Culture, blood (single)     Status: None   Collection Time: 11/12/13  4:45 PM  Result Value Ref Range Status   Micro Text Report   Final       COMMENT                   NO GROWTH AEROBICALLY/ANAEROBICALLY IN 5 DAYS   ANTIBIOTIC                                                        Coagulation Studies: No results for input(s): LABPROT, INR in the last 72 hours.  Urinalysis: No results for input(s): COLORURINE, LABSPEC, PHURINE, GLUCOSEU, HGBUR, BILIRUBINUR, KETONESUR, PROTEINUR, UROBILINOGEN, NITRITE, LEUKOCYTESUR in the last 168 hours.  Invalid input(s): APPERANCEUR  Lipid Panel:  No results found for: CHOL, TRIG, HDL, CHOLHDL, VLDL, LDLCALC  HgbA1C: No results found for: HGBA1C  Urine Drug Screen:     Component Value Date/Time   LABOPIA NONE DETECTED 04/15/2015 2120   LABBENZ NONE DETECTED 04/15/2015 2120   AMPHETMU NONE DETECTED 04/15/2015 2120   THCU POSITIVE* 04/15/2015 2120   LABBARB NONE DETECTED 04/15/2015 2120    Alcohol Level:  Recent Labs Lab 04/15/15 1853  ETH <5    Other  results: EKG: normal EKG, normal sinus rhythm, unchanged from previous tracings.  Imaging: Ct Head Wo Contrast  04/15/2015  CLINICAL DATA:  Status post fall, seizure EXAM: CT HEAD WITHOUT CONTRAST TECHNIQUE: Contiguous axial images were obtained from the base of the skull through the vertex without intravenous contrast. COMPARISON:  None. FINDINGS: There is no evidence of mass effect, midline shift, or extra-axial fluid collections. There is no evidence of a space-occupying lesion or intracranial  hemorrhage. There is no evidence of a cortical-based area of acute infarction. There is generalized cerebral atrophy. The ventricles and sulci are appropriate for the patient's age. The basal cisterns are patent. Visualized portions of the orbits are unremarkable. The visualized portions of the paranasal sinuses and mastoid air cells are unremarkable. The osseous structures are unremarkable. IMPRESSION: No acute intracranial pathology. Electronically Signed   By: Elige Ko   On: 04/15/2015 19:26     Assessment/Plan:  55 y.o. male  male with a known history of hepatitis C who is presenting with seizure. Witnessed seizure by wife who is present at bedside states that full tonic-clonic seizure followed by postictal state in total entire episode lasted 15 minutes (including postictal). Pt had an episode of seizure x 2 about 1-2 yrs ago Currently back to baseline.    - Con't Keppra BID daily indefinately now.   - MRI brain pending with and without contrast - No driving until follow up as out pt - pt/ot - EEG ordered but pt is back to baseline. Not convinced needs it as this point in time - d/c planning after above work up.  Pauletta Browns  04/16/2015, 11:04 AM

## 2015-04-16 NOTE — Progress Notes (Signed)
[facility] - [place] at [facility]   PATIENT NAME: Patricia Perales    MR#:  [id number]  DATE OF BIRTH:  Sep 30, 1960  SUBJECTIVE:  CHIEF COMPLAINT:   Chief Complaint  Patient presents with  . Seizures   - admitted after 2 episodes of tonic-clonic seizures. None since admission. -Receiving IV Keppra. For MRI of his brain today and also neurology consult pending. -Wife at bedside  REVIEW OF SYSTEMS:  Review of Systems  Constitutional: Negative for fever, chills and malaise/fatigue.  HENT: Negative for ear discharge, ear pain, nosebleeds and tinnitus.   Eyes: Negative for blurred vision and double vision.  Respiratory: Negative for cough, shortness of breath and wheezing.   Cardiovascular: Negative for chest pain, palpitations and leg swelling.  Gastrointestinal: Negative for nausea, vomiting, abdominal pain, diarrhea and constipation.  Genitourinary: Negative for dysuria and urgency.  Musculoskeletal: Positive for myalgias.  Neurological: Positive for seizures. Negative for dizziness, sensory change, speech change, focal weakness and headaches.  Psychiatric/Behavioral: Positive for substance abuse.    DRUG ALLERGIES:  No Known Allergies  VITALS:  Blood pressure 101/55, pulse 75, temperature 98 F (36.7 C), temperature source Oral, resp. rate 18, height 6' (1.829 m), weight 74.844 kg (165 lb), SpO2 96 %.  PHYSICAL EXAMINATION:  Physical Exam  GENERAL:  55 y.o.-year-old patient lying in the bed with no acute distress.  EYES: Pupils equal, round, reactive to light and accommodation. No scleral icterus. Extraocular muscles intact.  HEENT: Head atraumatic, normocephalic. Oropharynx and nasopharynx clear. No tongue bite noted. NECK:  Supple, no jugular venous distention. No thyroid enlargement, no tenderness.  LUNGS: Normal breath sounds bilaterally, no wheezing, rales,rhonchi or crepitation. No use of accessory muscles of respiration.  CARDIOVASCULAR: S1, S2  normal. No murmurs, rubs, or gallops.  ABDOMEN: Soft, nontender, nondistended. Bowel sounds present. No organomegaly or mass.  EXTREMITIES: No pedal edema, cyanosis, or clubbing.  NEUROLOGIC: Cranial nerves II through XII are intact. Muscle strength 5/5 in all extremities. Sensation intact. Gait not checked. Slurred speech- wife believes that is because of pain medicine he received PSYCHIATRIC: The patient is alert and oriented x 3.  SKIN: No obvious rash, lesion, or ulcer.    LABORATORY PANEL:   CBC  Recent Labs Lab 04/16/15 0500  WBC 8.3  HGB 13.4  HCT 38.6*  PLT 180   ------------------------------------------------------------------------------------------------------------------  Chemistries   Recent Labs Lab 04/16/15 0500  NA 136  K 3.5  CL 105  CO2 25  GLUCOSE 106*  BUN 9  CREATININE 0.68  CALCIUM 8.6*  AST 85*  ALT 71*  ALKPHOS 43  BILITOT 0.5   ------------------------------------------------------------------------------------------------------------------  Cardiac Enzymes No results for input(s): TROPONINI in the last 168 hours. ------------------------------------------------------------------------------------------------------------------  RADIOLOGY:  Ct Head Wo Contrast  04/15/2015  CLINICAL DATA:  Status post fall, seizure EXAM: CT HEAD WITHOUT CONTRAST TECHNIQUE: Contiguous axial images were obtained from the base of the skull through the vertex without intravenous contrast. COMPARISON:  None. FINDINGS: There is no evidence of mass effect, midline shift, or extra-axial fluid collections. There is no evidence of a space-occupying lesion or intracranial hemorrhage. There is no evidence of a cortical-based area of acute infarction. There is generalized cerebral atrophy. The ventricles and sulci are appropriate for the patient's age. The basal cisterns are patent. Visualized portions of the orbits are unremarkable. The visualized portions of the paranasal  sinuses and mastoid air cells are unremarkable. The osseous structures are unremarkable. IMPRESSION: No acute intracranial pathology. Electronically Signed  By: Elige KoHetal  Patel   On: 04/15/2015 19:26    EKG:   Orders placed or performed during the hospital encounter of 04/15/15  . EKG 12-Lead  . EKG 12-Lead    ASSESSMENT AND PLAN:   55 year old male with past medical history significant for hepatitis C and neuropathy admitted to the hospital after generalized tonic-clonic seizures.  #1 new onset generalized tonic-clonic seizures-no aura reported. No significant stress factors. -Urine tox positive for marijuana -Seizures last year with hypoglycemia. No hypoglycemia now. -Still some postictal confusion present. -Continue Keppra for now. Neurology consult is pending. -MRI and EEG today.  #2 hepatitis C-not on any home medications. -Check liver enzymes  #3 neuropathy-again not in any medications. -Check B12 level  #4 hypernatremia-improving with IV fluids  # 5 DVT prophylaxis-on Lovenox  #6 GERD- protonix   Discharge later today or tomorrow   All the records are reviewed and case discussed with Care Management/Social Workerr. Management plans discussed with the patient, family and they are in agreement.  CODE STATUS: Full Code  TOTAL TIME TAKING CARE OF THIS PATIENT: 38 minutes.   POSSIBLE D/C IN 1 DAYS, DEPENDING ON CLINICAL CONDITION.   Enid BaasKALISETTI,Keaun Schnabel M.D on 04/16/2015 at 10:54 AM  Between 7am to 6pm - Pager - [phone]  After 6pm go to [email or web address] - password [email or web address] [email or web address]  [facility]  Office  [phone]  CC: Primary care physician; No primary care provider on file.

## 2015-04-16 NOTE — Discharge Summary (Signed)
**Note Andres-Identified via Obfuscation** [facility] - [place] at [facility]   PATIENT NAME: Andres Brown    MR#:  [id number]  DATE OF BIRTH:  14-Jan-1960  DATE OF ADMISSION:  04/15/2015 ADMITTING PHYSICIAN: Wyatt Haste, MD  DATE OF DISCHARGE: 04/16/15  PRIMARY CARE PHYSICIAN: No primary care provider on file.    ADMISSION DIAGNOSIS:  Seizure (HCC) [R56.9]  DISCHARGE DIAGNOSIS:  Principal Problem:   Seizure (HCC)   SECONDARY DIAGNOSIS:   Past Medical History  Diagnosis Date  . Hepatitis C   . Pancreatitis   . Neuropathy (HCC)   . Hypoglycemia   . Hernia of abdominal wall     left sided. PCP aware.     HOSPITAL COURSE:   55 year old male with past medical history significant for hepatitis C and neuropathy admitted to the hospital after generalized tonic-clonic seizures.  #1 new onset generalized tonic-clonic seizures-no aura reported. No significant stress factors. -Urine tox positive for marijuana -Seizures last year with hypoglycemia. No hypoglycemia now. - MRI brain with and without contrast with no acute findings - Appreciate Neurology consult - received keppra in hospital- will be discharged on the same-  bid  #2 hepatitis C-not on any home medications. - stable liver tests. monitor  #3 neuropathy-again not in any medications. -f/u B12 level  #6 GERD- discharged on PPI  Being discharged today  DISCHARGE CONDITIONS:   Stable  CONSULTS OBTAINED:  Treatment Team:  Wyatt Haste, MD Pauletta Browns, MD  DRUG ALLERGIES:  No Known Allergies  DISCHARGE MEDICATIONS:   Current Discharge Medication List    START taking these medications   Details  benzonatate (TESSALON) 100 MG capsule Take 1 capsule (100 mg total) by mouth 3 (three) times daily as needed for cough. Qty: 20 capsule, Refills: 0    levETIRAcetam (KEPPRA) 1000 MG tablet Take 1 tablet (1,000 mg total) by mouth 2 (two) times daily. Qty: 60 tablet, Refills: 2    omeprazole (PRILOSEC) 20 MG  capsule Take 1 capsule (20 mg total) by mouth daily. Qty: 30 capsule, Refills: 2         DISCHARGE INSTRUCTIONS:   1. STOP MARIJUANA 2. PCP f/u in 2 weeks 3. Neurology f/u in 2-3 weeks 4. No driving for 6 months  If you experience worsening of your admission symptoms, develop shortness of breath, life threatening emergency, suicidal or homicidal thoughts you must seek medical attention immediately by calling 911 or calling your MD immediately  if symptoms less severe.  You Must read complete instructions/literature along with all the possible adverse reactions/side effects for all the Medicines you take and that have been prescribed to you. Take any new Medicines after you have completely understood and accept all the possible adverse reactions/side effects.   Please note  You were cared for by a hospitalist during your hospital stay. If you have any questions about your discharge medications or the care you received while you were in the hospital after you are discharged, you can call the unit and asked to speak with the hospitalist on call if the hospitalist that took care of you is not available. Once you are discharged, your primary care physician will handle any further medical issues. Please note that NO REFILLS for any discharge medications will be authorized once you are discharged, as it is imperative that you return to your primary care physician (or establish a relationship with a primary care physician if you do not have one) for your aftercare needs so that they can reassess  your need for medications and monitor your lab values.    Today   CHIEF COMPLAINT:   Chief Complaint  Patient presents with  . Seizures    VITAL SIGNS:  Blood pressure 111/74, pulse 69, temperature 97.8 F (36.6 C), temperature source Oral, resp. rate 20, height 6' (1.829 m), weight 74.844 kg (165 lb), SpO2 97 %.  I/O:   Intake/Output Summary (Last 24 hours) at 04/16/15 1440 Last data filed at  04/16/15 1130  Gross per 24 hour  Intake    240 ml  Output    150 ml  Net     90 ml    PHYSICAL EXAMINATION:   Physical Exam   GENERAL: 55 y.o.-year-old patient lying in the bed with no acute distress.  EYES: Pupils equal, round, reactive to light and accommodation. No scleral icterus. Extraocular muscles intact.  HEENT: Head atraumatic, normocephalic. Oropharynx and nasopharynx clear. No tongue bite noted. echhymoses on left side of head NECK: Supple, no jugular venous distention. No thyroid enlargement, no tenderness.  LUNGS: Normal breath sounds bilaterally, no wheezing, rales,rhonchi or crepitation. No use of accessory muscles of respiration.  CARDIOVASCULAR: S1, S2 normal. No murmurs, rubs, or gallops.  ABDOMEN: Soft, nontender, nondistended. Bowel sounds present. No organomegaly or mass.  EXTREMITIES: No pedal edema, cyanosis, or clubbing. bruising on left forearm from the fall NEUROLOGIC: Cranial nerves II through XII are intact. Muscle strength 5/5 in all extremities. Sensation intact. Gait not checked. Slurred speech- wife believes that is because of pain medicine he received PSYCHIATRIC: The patient is alert and oriented x 3.  SKIN: No obvious rash, lesion, or ulcer.   DATA REVIEW:   CBC  Recent Labs Lab 04/16/15 0500  WBC 8.3  HGB 13.4  HCT 38.6*  PLT 180    Chemistries   Recent Labs Lab 04/16/15 0500  NA 136  K 3.5  CL 105  CO2 25  GLUCOSE 106*  BUN 9  CREATININE 0.68  CALCIUM 8.6*  AST 85*  ALT 71*  ALKPHOS 43  BILITOT 0.5    Cardiac Enzymes No results for input(s): TROPONINI in the last 168 hours.  Microbiology Results  Results for orders placed or performed in visit on 11/12/13  Culture, blood (single)     Status: None   Collection Time: 11/12/13  4:41 PM  Result Value Ref Range Status   Micro Text Report   Final       COMMENT                   NO GROWTH AEROBICALLY/ANAEROBICALLY IN 5 DAYS   ANTIBIOTIC                                                       Culture, blood (single)     Status: None   Collection Time: 11/12/13  4:45 PM  Result Value Ref Range Status   Micro Text Report   Final       COMMENT                   NO GROWTH AEROBICALLY/ANAEROBICALLY IN 5 DAYS   ANTIBIOTIC  RADIOLOGY:  Ct Head Wo Contrast  04/15/2015  CLINICAL DATA:  Status post fall, seizure EXAM: CT HEAD WITHOUT CONTRAST TECHNIQUE: Contiguous axial images were obtained from the base of the skull through the vertex without intravenous contrast. COMPARISON:  None. FINDINGS: There is no evidence of mass effect, midline shift, or extra-axial fluid collections. There is no evidence of a space-occupying lesion or intracranial hemorrhage. There is no evidence of a cortical-based area of acute infarction. There is generalized cerebral atrophy. The ventricles and sulci are appropriate for the patient's age. The basal cisterns are patent. Visualized portions of the orbits are unremarkable. The visualized portions of the paranasal sinuses and mastoid air cells are unremarkable. The osseous structures are unremarkable. IMPRESSION: No acute intracranial pathology. Electronically Signed   By: Elige KoHetal  Patel   On: 04/15/2015 19:26   Mr Brain W Wo Contrast  04/16/2015  CLINICAL DATA:  Seizure. EXAM: MRI HEAD WITHOUT AND WITH CONTRAST TECHNIQUE: Multiplanar, multiecho pulse sequences of the brain and surrounding structures were obtained without and with intravenous contrast. CONTRAST:  15mL MULTIHANCE GADOBENATE DIMEGLUMINE 529 MG/ML IV SOLN COMPARISON:  CT head 04/15/2015 FINDINGS: Image quality degraded by mild motion. Fast scanning performed due to claustrophobia and motion. Mild atrophy.  Ventricle size is within normal limits. Negative for acute or chronic infarction. Negative for demyelinating disease. Cerebral white matter normal. Brainstem and cerebellum normal. Negative for intracranial hemorrhage.  Negative for mass or edema. No shift of the midline structures. Medial temporal lobe normal in signal and volume bilaterally Normal enhancement following contrast infusion. No enhancing mass lesion. Vascular enhancement and leptomeningeal enhancement normal. Paranasal sinuses clear.  Normal skullbase.  Normal orbit. IMPRESSION: Mild atrophy. Otherwise negative MRI of the brain with contrast. Image quality degraded by mild motion. Electronically Signed   By: Marlan Palauharles  Clark M.D.   On: 04/16/2015 13:15    EKG:   Orders placed or performed during the hospital encounter of 04/15/15  . EKG 12-Lead  . EKG 12-Lead      Management plans discussed with the patient, family and they are in agreement.  CODE STATUS:     Code Status Orders        Start     Ordered   04/15/15 2047  Full code   Continuous     04/15/15 2046    Code Status History    Date Active Date Inactive Code Status Order ID Comments User Context   This patient has a current code status but no historical code status.      TOTAL TIME TAKING CARE OF THIS PATIENT: 35 minutes.    Enid BaasKALISETTI,Valerie Cones M.D on 04/16/2015 at 2:40 PM  Between 7am to 6pm - Pager - [phone]  After 6pm go to [email or web address] - password [email or web address] [email or web address]  [facility]  Office  [phone]  CC: Primary care physician; No primary care provider on file.

## 2016-09-03 IMAGING — MR MR HEAD WO/W CM
10 of 13 series · 37 of 48 positions shown · IV contrast (multihance)
Comparison: CT head 04/15/2015

CLINICAL DATA: Seizure.

EXAM:
MRI HEAD WITHOUT AND WITH CONTRAST
TECHNIQUE: Multiplanar, multiecho pulse sequences of the brain and surrounding
structures were obtained without and with intravenous contrast.
CONTRAST:  15mL MULTIHANCE GADOBENATE DIMEGLUMINE 529 MG/ML IV SOLN

[Series 4: DWI · axial · 4.0mm · 0.94mm/px · z∈[-90,+80]mm · 4 of 44 slices shown (1 of 4)]
[im 1/44]
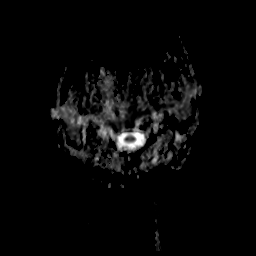
[im 15/44]
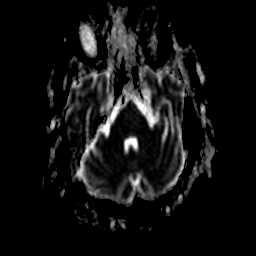
[im 29/44]
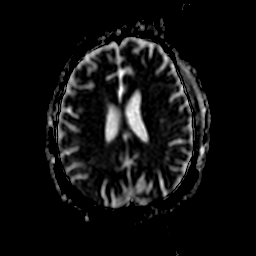
[im 44/44]
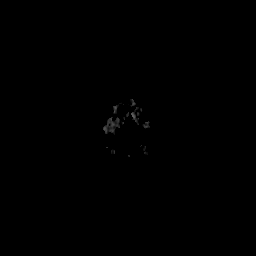

[Series 5: DWI · axial · 4.0mm · 0.94mm/px · z∈[-90,+80]mm · 4 of 42 slices shown (2 of 4)]
[im 1/42]
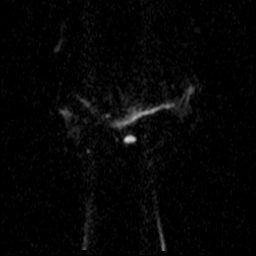
[im 14/42]
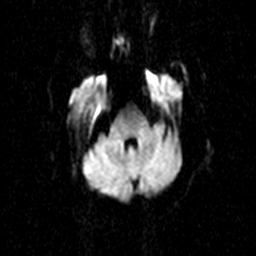
[im 28/42]
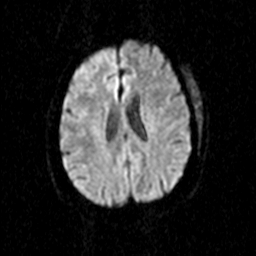
[im 42/42]
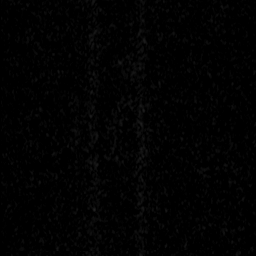

[Series 7: DWI · coronal · 5.0mm · 1.80mm/px · 4 of 41 slices shown (3 of 4)]
[im 1/41]
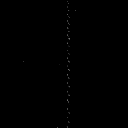
[im 14/41]
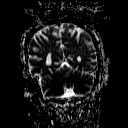
[im 27/41]
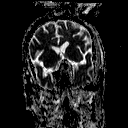
[im 41/41]
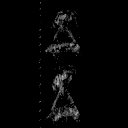

[Series 8: DWI · coronal · 5.0mm · 1.80mm/px · 4 of 39 slices shown (4 of 4)]
[im 1/39]
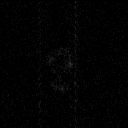
[im 13/39]
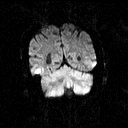
[im 26/39]
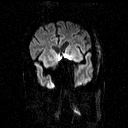
[im 39/39]
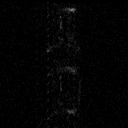

[Series 9: T2 · axial · 5.0mm · 0.45mm/px · z∈[-69,+97]mm · 3 of 27 slices shown (1 of 3)]
[im 1/27]
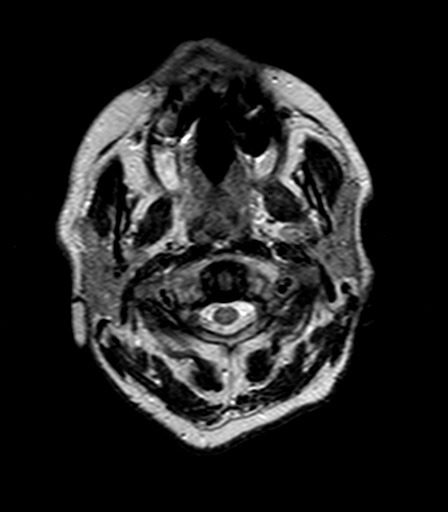
[im 14/27]
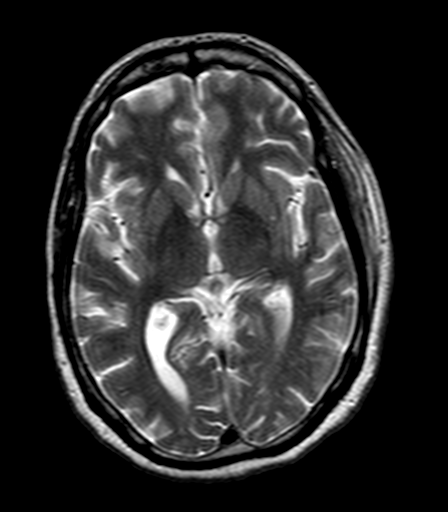
[im 27/27]
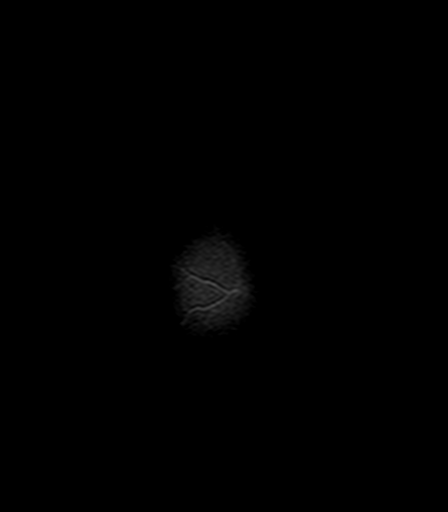

[Series 10: FLAIR · axial · 5.0mm · 0.90mm/px · z∈[-67,+99]mm · 3 of 27 slices shown]
[im 1/27]
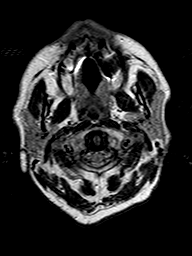
[im 14/27]
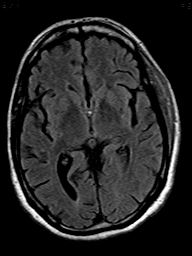
[im 27/27]
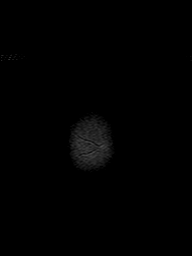

[Series 11: T2 · axial · 5.0mm · 0.45mm/px · z∈[-69,+97]mm · 3 of 27 slices shown (2 of 3)]
[im 1/27]
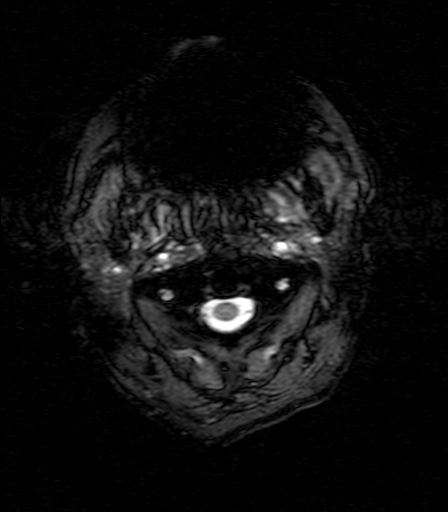
[im 14/27]
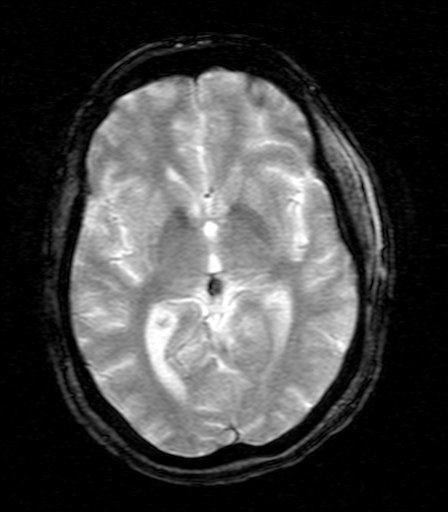
[im 27/27]
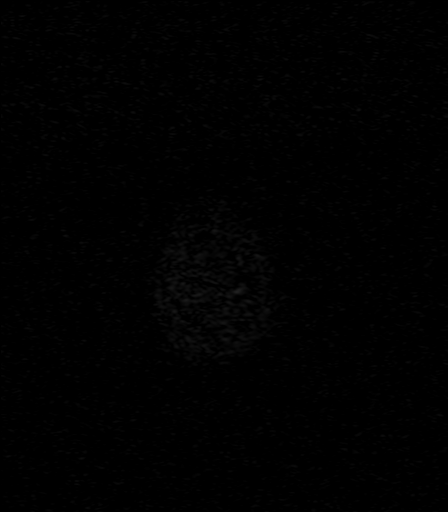

[Series 13: T2 · coronal · 3.0mm · 0.35mm/px · 3 of 30 slices shown (3 of 3)]
[im 1/30]
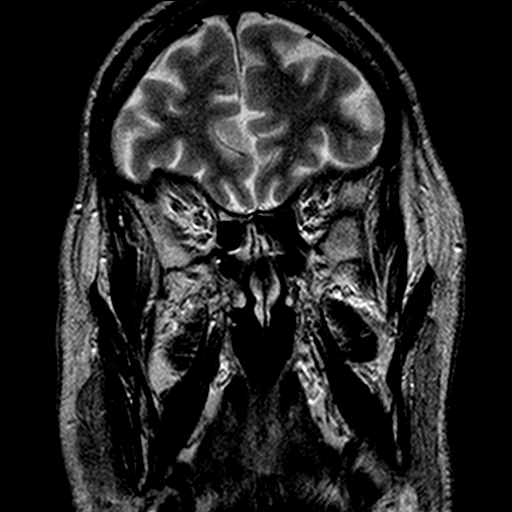
[im 15/30]
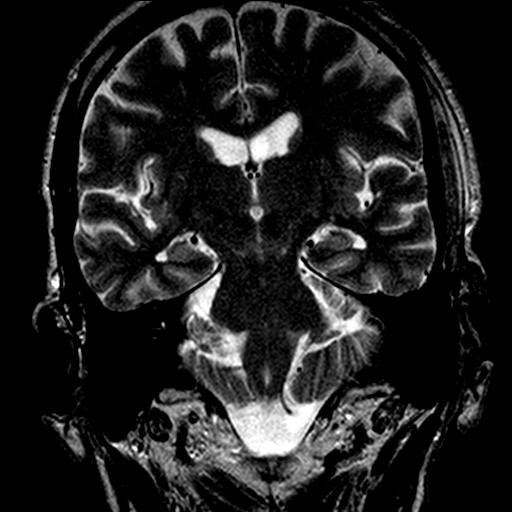
[im 30/30]
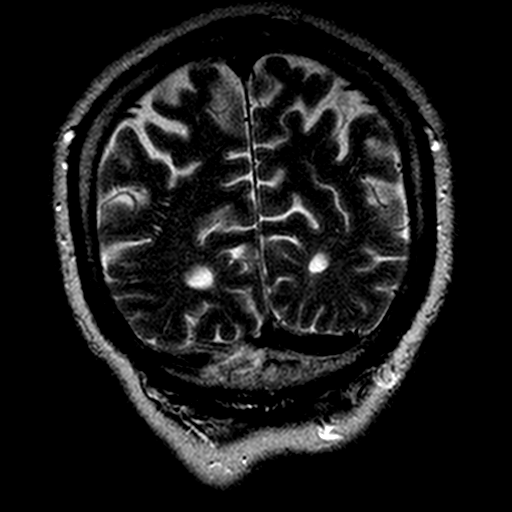

[Series 15: T1 post-contrast · axial · 3.0mm · 0.45mm/px · z∈[-72,+101]mm · 6 of 60 slices shown (1 of 2)]
[im 1/60]
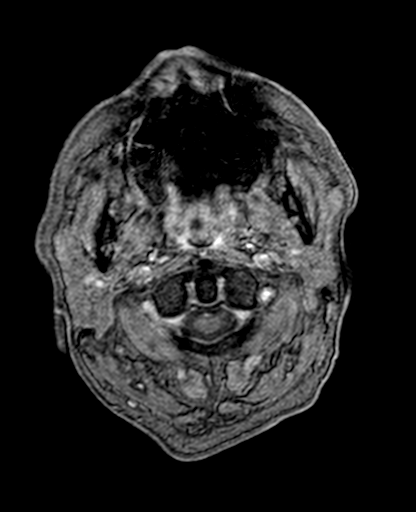
[im 12/60]
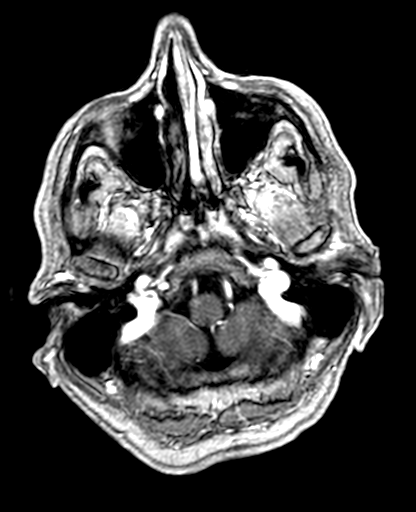
[im 24/60]
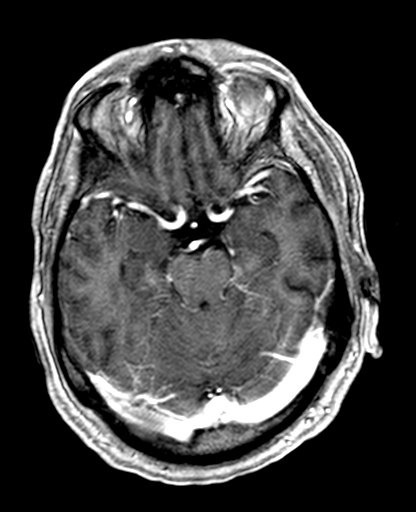
[im 36/60]
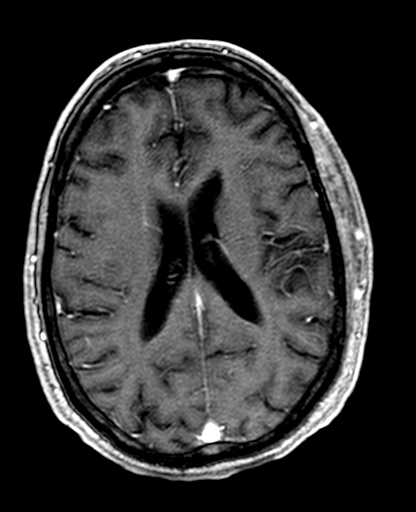
[im 48/60]
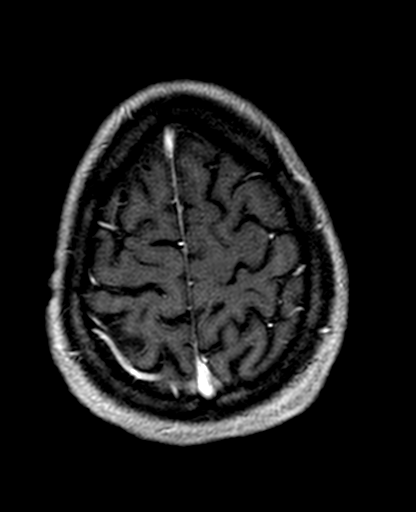
[im 60/60]
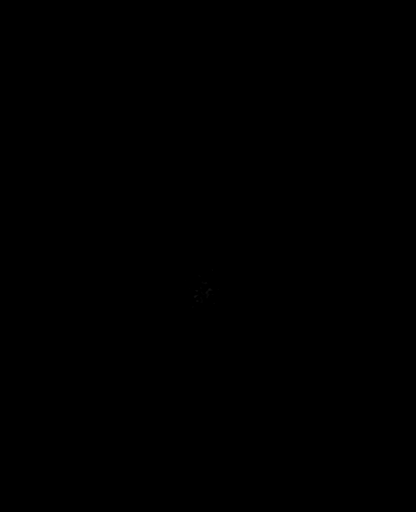

[Series 16: T1 post-contrast · coronal · 5.0mm · 0.45mm/px · 3 of 31 slices shown (2 of 2)]
[im 1/31]
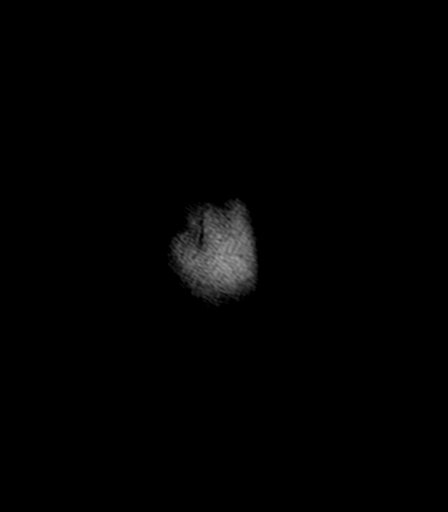
[im 16/31]
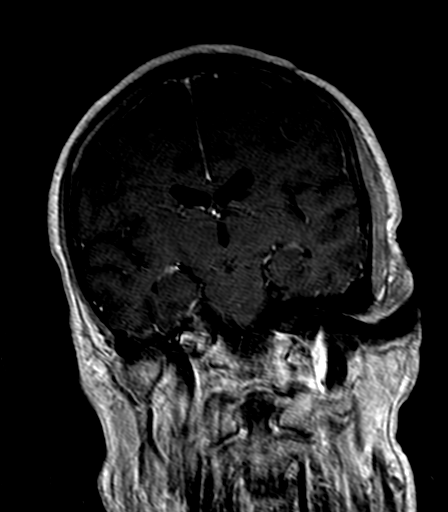
[im 31/31]
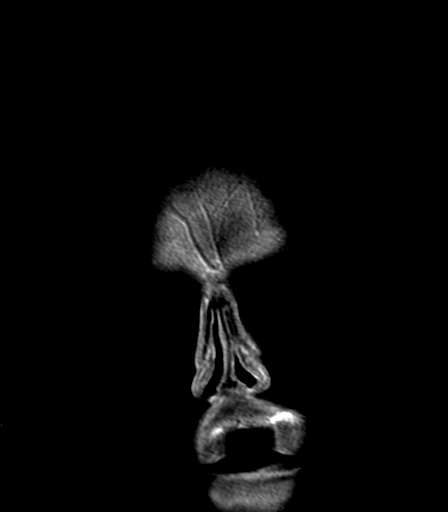

[37 of 48 positions shown; findings below may reference images not displayed]

FINDINGS: Image quality degraded by mild motion. Fast scanning performed due
to claustrophobia and motion.

Mild atrophy.  Ventricle size is within normal limits.

Negative for acute or chronic infarction.

Negative for demyelinating disease. Cerebral white matter normal.
Brainstem and cerebellum normal.

Negative for intracranial hemorrhage. Negative for mass or edema. No
shift of the midline structures.

Medial temporal lobe normal in signal and volume bilaterally

Normal enhancement following contrast infusion. No enhancing mass
lesion. Vascular enhancement and leptomeningeal enhancement normal.

Paranasal sinuses clear.  Normal skullbase.  Normal orbit.
IMPRESSION: Mild atrophy. Otherwise negative MRI of the brain with contrast.
Image quality degraded by mild motion.
